# Patient Record
Sex: Female | Born: 1970 | Hispanic: Yes | Marital: Married
Health system: Southern US, Community
[De-identification: ages and names within clinical notes are randomized; demographics above are authoritative.]

---

## 2018-02-04 ENCOUNTER — Emergency Department (HOSPITAL_COMMUNITY)
Admission: EM | Admit: 2018-02-04 | Discharge: 2018-02-04 | Disposition: A | Discharge status: Home / Self Care | Payer: Self-pay | Attending: Emergency Medicine | Admitting: Emergency Medicine

## 2018-02-04 ENCOUNTER — Encounter (HOSPITAL_COMMUNITY): Payer: Self-pay | Admitting: Pharmacy Technician

## 2018-02-04 ENCOUNTER — Emergency Department (HOSPITAL_COMMUNITY): Payer: Self-pay

## 2018-02-04 ENCOUNTER — Other Ambulatory Visit: Payer: Self-pay

## 2018-02-04 DIAGNOSIS — M25561 Pain in right knee: Secondary | ICD-10-CM | POA: Insufficient documentation

## 2018-02-04 MED ORDER — IBUPROFEN 400 MG PO TABS
600.0000 mg | ORAL_TABLET | Freq: Once | ORAL | Status: AC
  Administered 2018-02-04: 600 mg via ORAL
  Filled 2018-02-04: qty 1

## 2018-02-04 MED ORDER — ACETAMINOPHEN 325 MG PO TABS: 650.0000 mg | ORAL_TABLET | Freq: Four times a day (QID) | ORAL | 0 refills | Status: AC | PRN

## 2018-02-04 NOTE — ED Provider Notes (Signed)
[facility] EMERGENCY DEPARTMENT Provider Note   CSN: [id number] Arrival date & time: 02/04/18  1200     History   Chief Complaint Chief Complaint  Patient presents with  . Knee Pain    right  . Fall    mechanical   History obtained using a [employer] interpreter. HPI Denise Harrison is a 47 y.o. female with no significant past medical history presents the emergent department today with right knee pain.  Patient reports that she was walking yesterday when her left foot slipped on a wet floor and her right knee bent on direction.  She denies any head trauma or loss of consciousness.  She reports since that time she has been having pain as well as swelling in her right knee.  She reports that the pain is worse when she is walking and also with maximal extension and flexion.  She denies any numbness/tingling/weakness that is associated with this.  She denies any open wounds.  She has tried Tylenol for symptoms with mild relief but notes continued pain.  She denies any pain in her hip, proximal leg, ankle or foot.  HPI  History reviewed. No pertinent past medical history.  There are no active problems to display for this patient.   History reviewed. No pertinent surgical history.   OB History   None      Home Medications    Prior to Admission medications   Not on File    Family History No family history on file.  Social History Social History   Tobacco Use  . Smoking status: Not on file  Substance Use Topics  . Alcohol use: Not on file  . Drug use: Not on file     Allergies   Patient has no allergy information on record.   Review of Systems Review of Systems  Musculoskeletal: Positive for arthralgias and joint swelling.  Skin: Negative for color change and wound.  Neurological: Negative for weakness and numbness.     Physical Exam Updated Vital Signs BP 139/74 (BP Location: Right Arm)   Pulse 62   Temp 98.3 F (36.8 C) (Oral)   Resp  18   Ht 5\' 3"  (1.6 m)   Wt 86.2 kg   SpO2 100%   BMI 33.66 kg/m   Physical Exam  Constitutional: She appears well-developed and well-nourished.  HENT:  Head: Normocephalic and atraumatic.  Right Ear: External ear normal.  Left Ear: External ear normal.  Eyes: Conjunctivae are normal. Right eye exhibits no discharge. Left eye exhibits no discharge. No scleral icterus.  Cardiovascular:  Pulses:      Dorsalis pedis pulses are 2+ on the right side.       Posterior tibial pulses are 2+ on the right side.  Pulmonary/Chest: Effort normal. No respiratory distress.  Musculoskeletal:       Right hip: She exhibits normal range of motion and no tenderness.       Right knee: She exhibits decreased range of motion, swelling and effusion. She exhibits no ecchymosis, no deformity, no laceration, no erythema, no LCL laxity, normal patellar mobility and no MCL laxity. Tenderness found. Medial joint line and MCL tenderness noted.       Right ankle: She exhibits normal range of motion and no swelling. No tenderness. Achilles tendon normal.       Right upper leg: Normal.       Right lower leg: Normal.       Right foot: Normal.  Intact straight leg  raise.  Negative Lachman's test.  Negative posterior drawer test. Compartments are soft. She is NVI distal.   Neurological: She is alert. She has normal strength. No sensory deficit.  Skin: Skin is warm and dry. Capillary refill takes less than 2 seconds. No abrasion and no laceration noted. No pallor.  Psychiatric: She has a normal mood and affect.  Nursing note and vitals reviewed.    ED Treatments / Results  Labs (all labs ordered are listed, but only abnormal results are displayed) Labs Reviewed - No data to display  EKG None  Radiology Dg Knee Complete 4 Views Right  Result Date: 02/04/2018 CLINICAL DATA:  Right knee pain after fall yesterday. EXAM: RIGHT KNEE - COMPLETE 4+ VIEW COMPARISON:  None. FINDINGS: Moderate suprapatellar joint  effusion is noted. Severe narrowing of lateral joint space is noted with osteophyte formation. No fracture or dislocation is noted. IMPRESSION: Moderate suprapatellar joint effusion. Severe degenerative joint disease is noted laterally. No fracture or dislocation is noted. Electronically Signed   By: Lupita Raider, M.D.   On: 02/04/2018 14:12    Procedures Procedures (including critical care time)  Medications Ordered in ED Medications  ibuprofen (ADVIL,MOTRIN) tablet 600 mg (600 mg Oral Given 02/04/18 1308)     Initial Impression / Assessment and Plan / ED Course  I have reviewed the triage vital signs and the nursing notes.  Pertinent labs & imaging results that were available during my care of the patient were reviewed by me and considered in my medical decision making (see chart for details).     47 y.o. female with right knee pain after fall. No head trauma or LOC.  Patient is neurovascular intact and compartments are soft.  There is no evidence of ligamentous injury.  Patient does have noted effusion.  No pain in joint above or below.  No open wounds.  X-ray without evidence of fracture or dislocation.  Will treat with knee immobilizer, crutches and rice therapy.  Will give referral to orthopedist. I advised the patient to follow-up with pcp or orthopedist in 1 week if pain continues for possibility of missed fracture or further injury including ligaments. Specific return precautions discussed. Time was given for all questions to be answered. The patient verbalized understanding and agreement with plan. The patient appears safe for discharge home.  Final Clinical Impressions(s) / ED Diagnoses   Final diagnoses:  Acute pain of right knee    ED Discharge Orders    None       Jacinto Halim, Cordelia Poche 02/04/18 1516    Tegeler, Canary Brim, MD 02/04/18 [phone]

## 2018-02-04 NOTE — ED Notes (Signed)
Knee immoblizer placed on pt's right knee. Tolerated well and informed on device. Pt also given crutches, tolerated well. Also informed on device.

## 2018-02-04 NOTE — ED Triage Notes (Signed)
Pt reports mechanical fall yesterday and twisted her R knee. Reports pain with bearing weight on the R knee. Slight edema noted.

## 2018-02-04 NOTE — Discharge Instructions (Addendum)
Please read and follow all provided instructions.  Your diagnoses today include: Knee Effusion   Tests performed today include: X-rays of the affected joint.  Vital signs. See below for your results today.   Be sure to read and understand instructions below prior to leaving the hospital. If your symptoms persist without any improvement in 1 week it is recommended that you follow up with the orthopedics listed above. Use your pain medication as prescribed.  Knee Effusion  The medical term for having fluid in your knee is effusion. This means something is wrong inside the knee. Some of the causes of fluid in the knee may be torn cartilage, a torn ligament, or bleeding into the joint from an injury. Small tears may heal on their own with conservative treatment. Conservative means rest, limited weight bearing activity and muscle strengthening exercises. Your recovery may take up to 6 weeks. Larger tears may require surgery.   TREATMENT  Rest, ice, compression and elevation (RICE therapy) are the basic modes of treatment.   Rest is needed to allow your body to heal. Routine activities can be resumed when comfortable (as described above).  Ice: Ice or gel packs can be used to reduce both pain and swelling. Ice is the most helpful within the first 24 to 48 hours after an injury or flareup from overusing a muscle or joint.  Ice is effective, has very few side effects, and is safe for most people to use. Place a dry or damp towel between the ice and skin. A damp towel will cool the skin more quickly, so you may need to shorten the time that the ice is used. For a more rapid response, add gentle compression to the ice. Ice for no more than 10 to 20 minutes at a time. The bonier the area you are icing, the less time it will take to get the benefits of ice. Check your skin after 5 minutes to make sure there are no signs of a poor response to cold or skin damage. Rest 20 minutes or more in between uses. Once your  skin is numb, you can end your treatment. You can test numbness by very lightly touching your skin. The touch should be so light that you do not see the skin dimple from the pressure of your fingertip. When using ice, most people will feel these normal sensations in this order: cold, burning, aching, and numbness. Do not use ice on someone who cannot communicate their responses to pain, such as small children or people with dementia.  If you expose your skin to cold temperatures for too long or without the proper protection, you can damage your skin or nerves. Watch for signs of skin damage due to cold.  Compression: this helps keep swelling down. It also gives support and helps with discomfort. If any lasting bandage has been applied, it should be removed and reapplied every 3-4 hours. It should not be applied tightly, but firmly enough to keep swelling down. Watch fingers or toes for swelling, discoloration, coldness, numbness or excessive pain. If any of these problems occur, removed the bandage and reapply loosely. Contact your caregiver if these problems continue. If you were given an knee imobilizer you may take it off at night and to take a shower or bath. Wiggle your toes in the splint several times per day if you are able.  Elevation helps reduce swelling and decrease your pain. With extremities such as the arms, hands, legs and feet, the injured  area should be placed near or above the level of the heart if possible (place pillows underneath you leg/foot while you sleep to achieve this). If your caregiver recommends crutches, use them as instructed (for up to) 1 week.  Do not drive a vehicle on pain medication. ACTIVITY:            - Weight bearing as tolerated            - Exercises should be limited to pain free range of motion  Knee Immobilization:: This is used to support and protect an injured or painful knee. Knee immobilizers keep your knee from being used while it is healing.  Use powder  to control irritation from sweat and friction.  Adjust the immobilizer to be firm but not tight. Signs of an immobilizer that is too tight include:   Swelling.   Numbness.   Color change in your foot or ankle.   Increased pain.  While resting, raise your leg above the level of your heart. This reduces throbbing and helps healing. Prop it up with pillows.  Remove the immobilizer to bathe and sleep. Wear it other times until you see your doctor again.               SEEK MEDICAL CARE IF:  You have an increase in bruising, swelling, or pain.  Your toes feel cold.  Pain relief is not achieved with medications.  EMERGENCY:: Your toes are numb or blue or you have severe pain.  You notice redness, swelling, warmth or increasing pain in your knee.  An unexplained oral temperature above 102 F (38.9 C) develops.  HOW TO MAKE AN ICE PACK  To make an ice pack, do one of the following:  Place crushed ice or a bag of frozen vegetables in a sealable plastic bag. Squeeze out the excess air. Place this bag inside another plastic bag. Slide the bag into a pillowcase or place a damp towel between your skin and the bag.  Mix 3 parts water with 1 part rubbing alcohol. Freeze the mixture in a sealable plastic bag. When you remove the mixture from the freezer, it will be slushy. Squeeze out the excess air. Place this bag inside another plastic bag. Slide the bag into a pillowcase or place a damp towel between your skin.  Additional Information:  Your vital signs today were: BP (!) 148/85 (BP Location: Left Arm)    Pulse 70    Temp 98.3 F (36.8 C) (Oral)    Resp 18    Ht 5\' 3"  (1.6 m)    Wt 86.2 kg    LMP  (LMP Unknown)    SpO2 98%    BMI 33.66 kg/m  If your blood pressure (BP) was elevated above 135/85 this visit, please have this repeated by your doctor within one month. ---------------

## 2019-12-30 IMAGING — DX DG KNEE COMPLETE 4+V*R*
4 series · 4 of 4 positions shown · non-contrast
Comparison: None.

CLINICAL DATA: Right knee pain after fall yesterday.

EXAM:
RIGHT KNEE - COMPLETE 4+ VIEW

[knee ap]
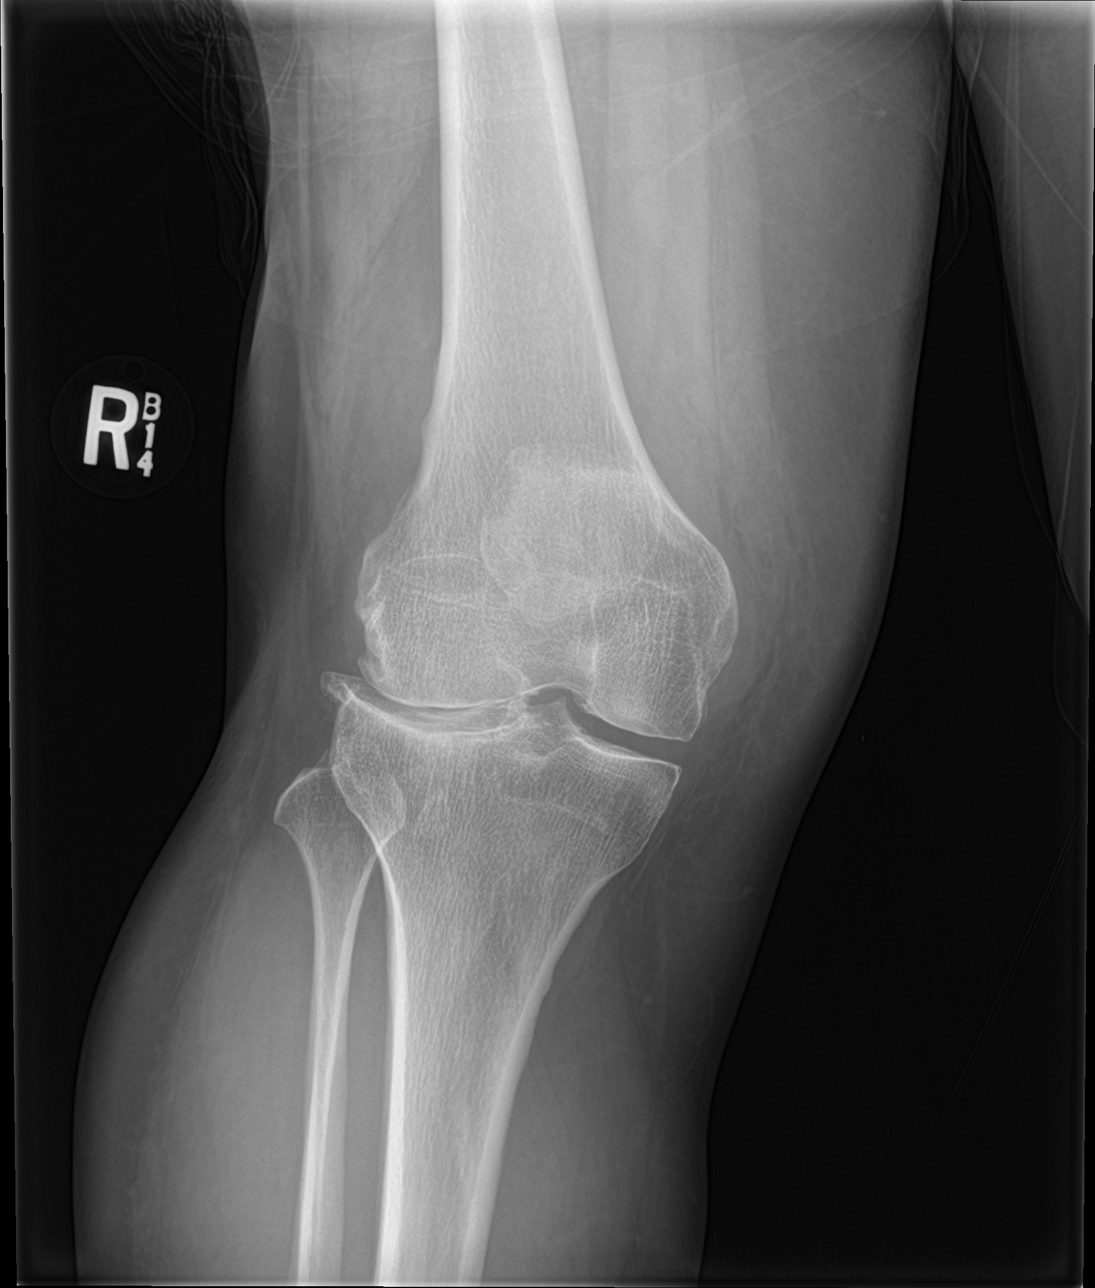

[knee lat]
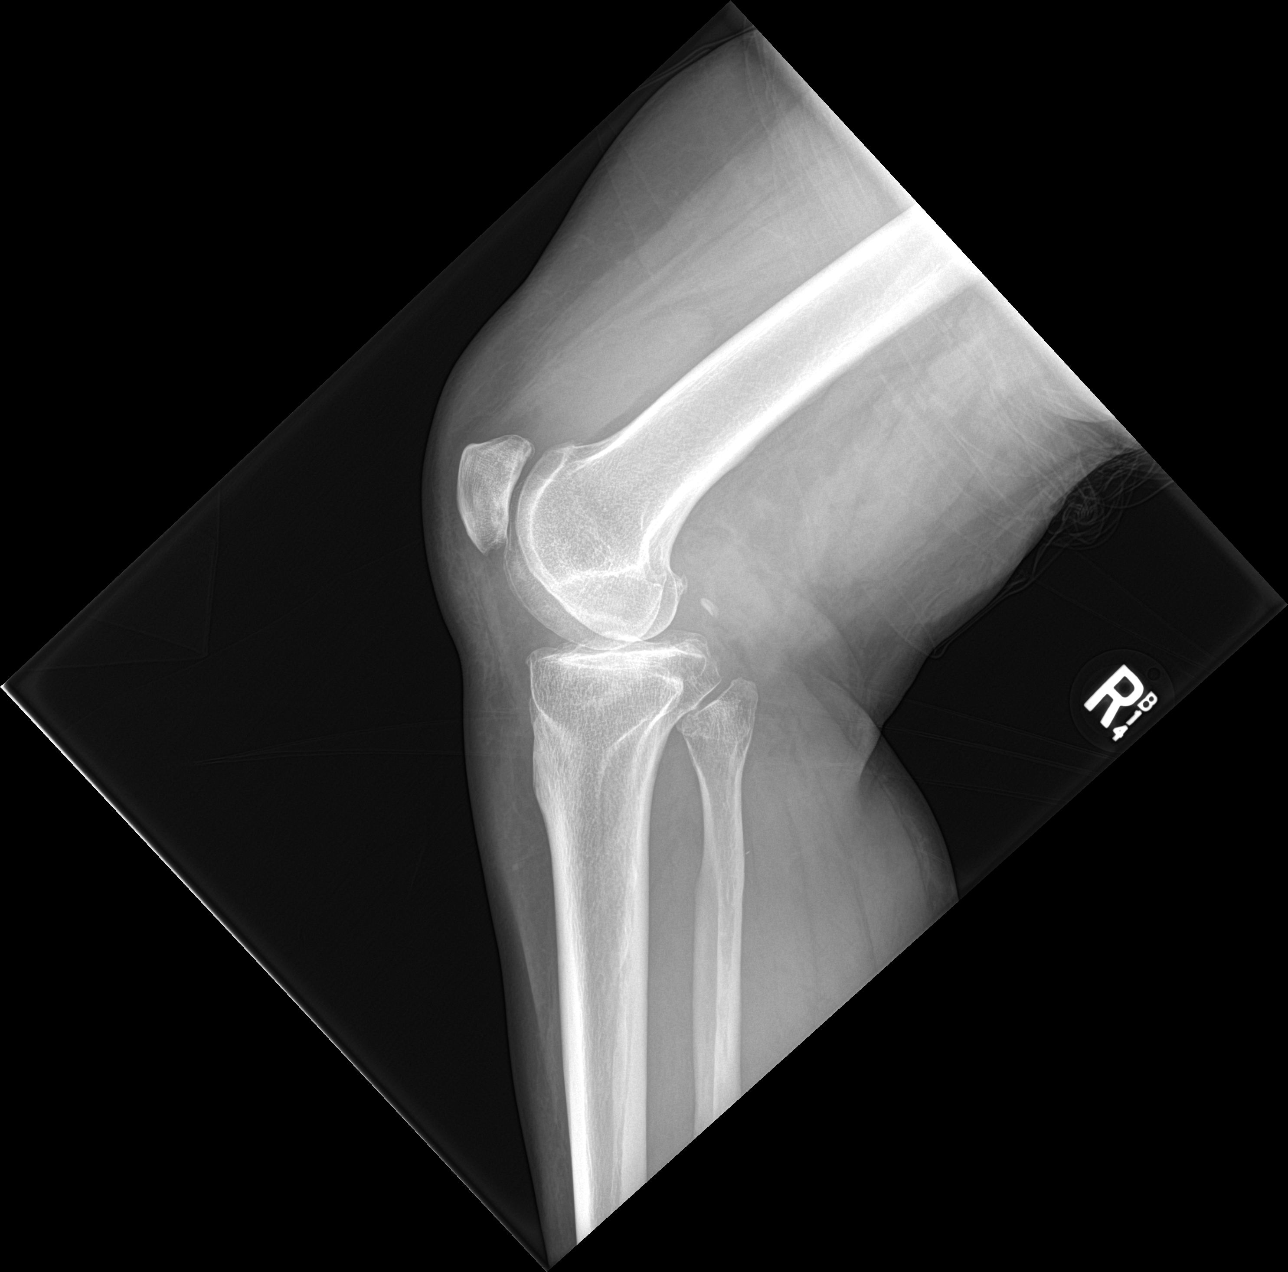

[knee obl (1 of 2)]
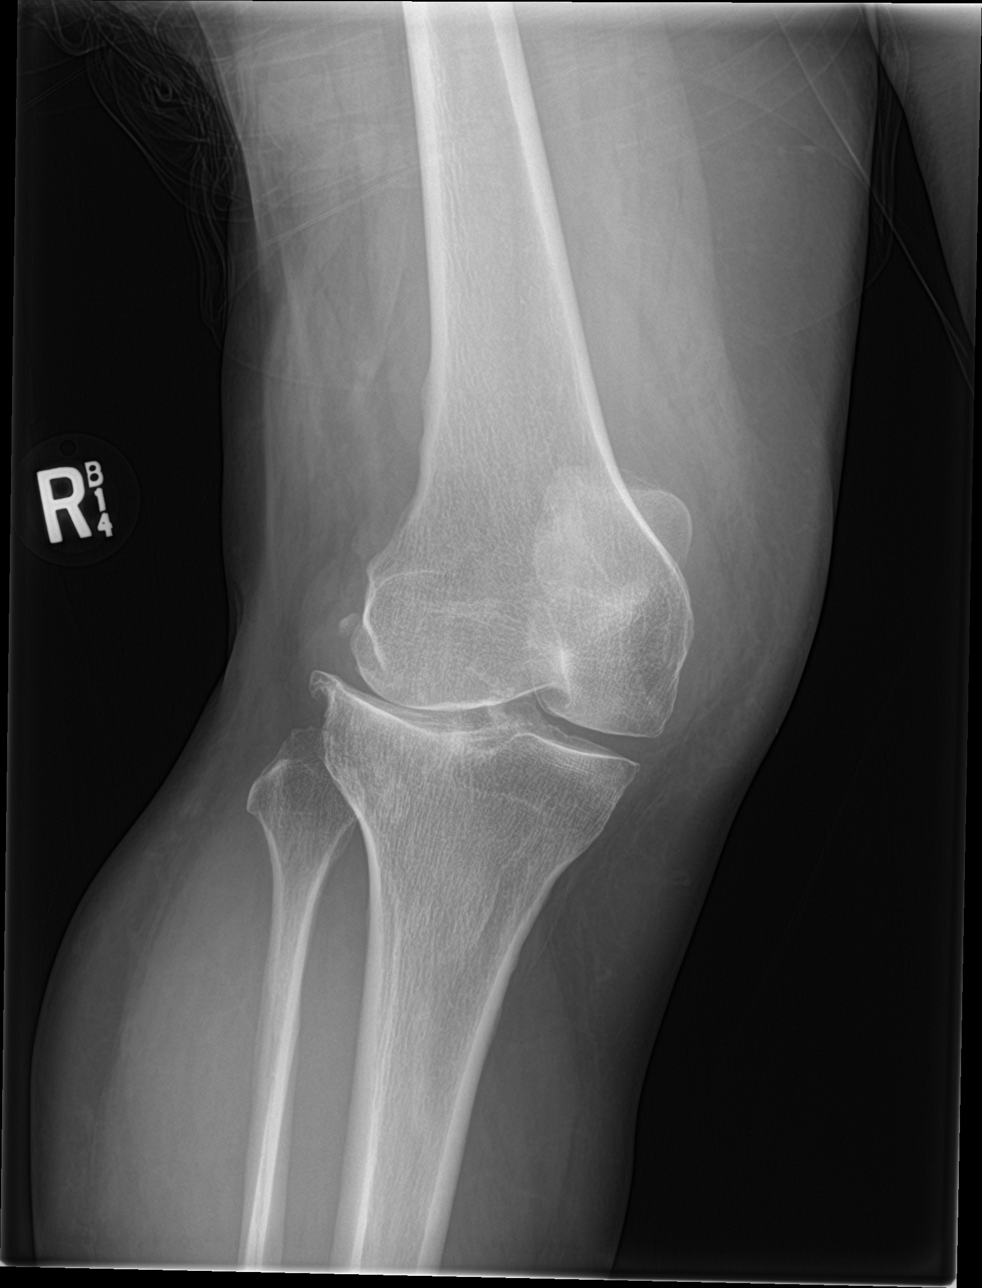

[knee obl (2 of 2)]
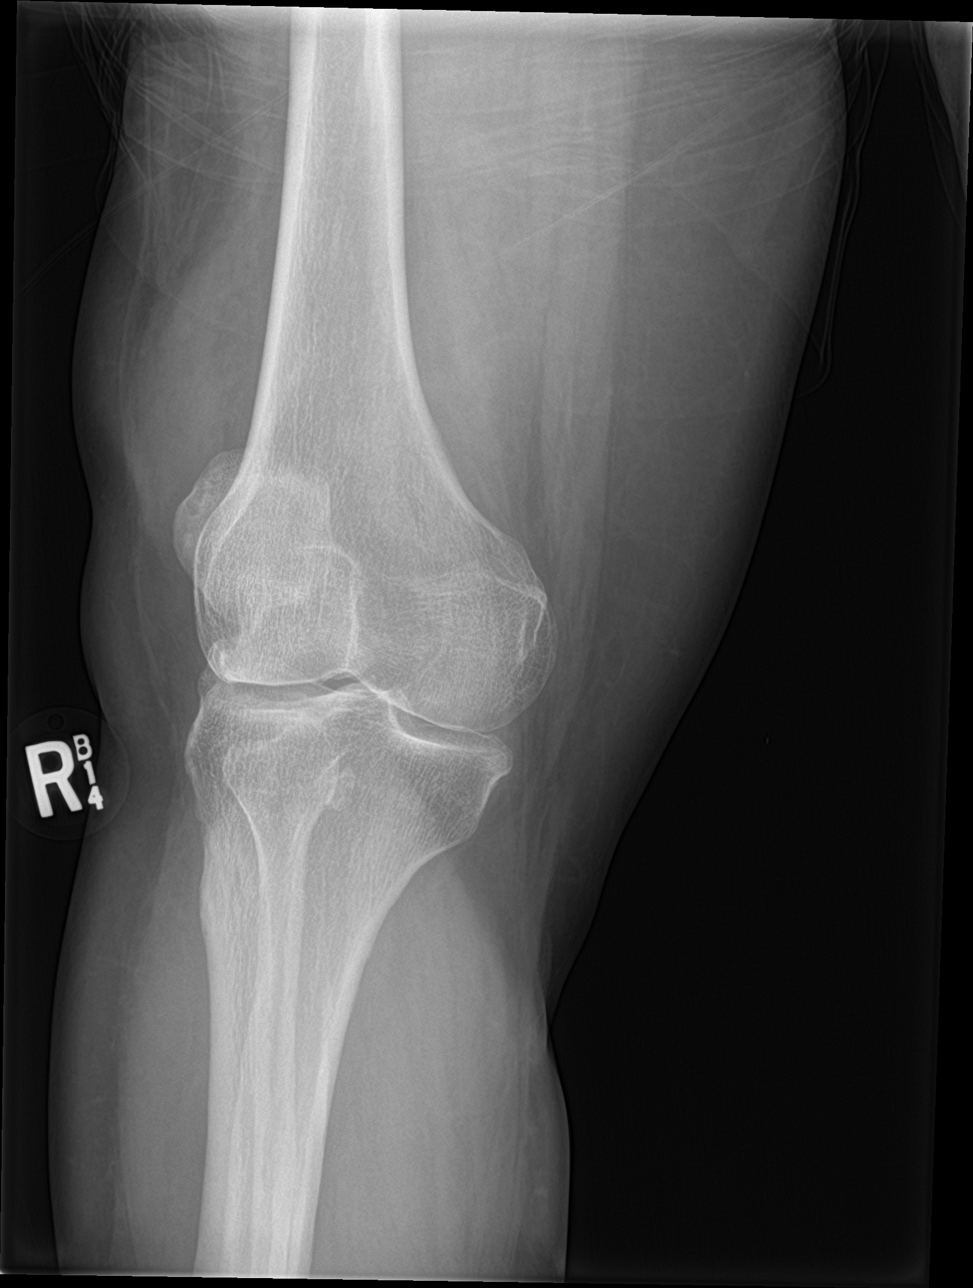

[4 of 4 positions shown; findings below may reference images not displayed]

FINDINGS: Moderate suprapatellar joint effusion is noted. Severe narrowing of
lateral joint space is noted with osteophyte formation. No fracture
or dislocation is noted.
IMPRESSION: Moderate suprapatellar joint effusion. Severe degenerative joint
disease is noted laterally. No fracture or dislocation is noted.
